# Patient Record
Sex: Female | Born: 2005
Health system: Southern US, Community
[De-identification: ages and names within clinical notes are randomized; demographics above are authoritative.]

## PROBLEM LIST (undated history)

## (undated) HISTORY — PX: TONSILLECTOMY: SUR1361

---
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
## 2023-02-16 DIAGNOSIS — N898 Other specified noninflammatory disorders of vagina: Secondary | ICD-10-CM | POA: Diagnosis not present

## 2023-03-30 ENCOUNTER — Telehealth: Payer: 59 | Admitting: Family Medicine

## 2023-03-30 DIAGNOSIS — R6889 Other general symptoms and signs: Secondary | ICD-10-CM

## 2023-03-31 NOTE — Progress Notes (Signed)

## 2023-04-28 ENCOUNTER — Encounter: Payer: Self-pay | Admitting: Family Medicine

## 2023-04-28 ENCOUNTER — Telehealth: Payer: 59 | Admitting: Family Medicine

## 2023-04-28 DIAGNOSIS — J069 Acute upper respiratory infection, unspecified: Secondary | ICD-10-CM

## 2023-04-28 NOTE — Progress Notes (Signed)

## 2023-05-01 ENCOUNTER — Ambulatory Visit (INDEPENDENT_AMBULATORY_CARE_PROVIDER_SITE_OTHER): Payer: 59 | Admitting: Adult Health

## 2023-05-01 ENCOUNTER — Encounter: Payer: Self-pay | Admitting: Adult Health

## 2023-05-01 DIAGNOSIS — F4323 Adjustment disorder with mixed anxiety and depressed mood: Secondary | ICD-10-CM | POA: Diagnosis not present

## 2023-05-01 DIAGNOSIS — F909 Attention-deficit hyperactivity disorder, unspecified type: Secondary | ICD-10-CM

## 2023-05-01 DIAGNOSIS — Z30432 Encounter for removal of intrauterine contraceptive device: Secondary | ICD-10-CM | POA: Diagnosis not present

## 2023-05-01 DIAGNOSIS — Z113 Encounter for screening for infections with a predominantly sexual mode of transmission: Secondary | ICD-10-CM | POA: Diagnosis not present

## 2023-05-01 DIAGNOSIS — Z30430 Encounter for insertion of intrauterine contraceptive device: Secondary | ICD-10-CM | POA: Diagnosis not present

## 2023-05-01 MED ORDER — AMPHETAMINE-DEXTROAMPHET ER 30 MG PO CP24: 30.0000 mg | ORAL_CAPSULE | Freq: Every day | ORAL | 0 refills | Status: DC

## 2023-05-01 NOTE — Progress Notes (Signed)
Jody Lewis [id number] [birth year] 17 y.o.  Subjective:   Patient ID:  Jody Lewis is a 17 y.o. (DOB 05-18-2006) female.  Chief Complaint: No chief complaint on file.   HPI Jody Lewis presents to the office today for follow-up of ADHD and Adjustment Disorder.   Accompanied by mother.  Describes mood today as "ok". Pleasant. Mood symptoms - reports depression some days - for several months. Reports anxiety and irritability. Reports more irritability since stopping vaping 1 month ago. Denies panic attacks. Reports some worry, rumination, and over thinking. Mood is stable. Stating "I feel very overwhelmed by everything right now". Has spent the last 2 weeks "emotionally detaching" from things.  Would like to restart the Adderall XR 30mg  daily. Stable interest and motivation. Taking medications as prescribed.  Energy levels stable. Active, has a regular exercise routine.   Enjoys some usual interests and activities. Dating. Has a boyfriend. Has a brother. Parents divorced. Living with mother. Spending time with family. Appetite adequate. Weight stable - 135 pounds 65". Sleeps well most nights. Averages 6 hours.  Focus and concentration difficulties - has been without Adderall over a month. Completing tasks. Managing aspects of household. 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 Denies SI or HI.  Denies AH or VH. Denies self harm. Denies substance use.  Previous medication trials: Adderall XR 10mg  daily    Review of Systems:  Review of Systems  Musculoskeletal:  Negative for gait problem.  Neurological:  Negative for tremors.  Psychiatric/Behavioral:         Please refer to HPI    Medications: I have reviewed the patient's current medications.  Current Outpatient Medications  Medication Sig Dispense Refill   amphetamine-dextroamphetamine (ADDERALL XR) 30 MG 24 hr capsule Take 1 capsule (30 mg total) by mouth daily. 30  capsule 0   amphetamine-dextroamphetamine (ADDERALL XR) 30 MG 24 hr capsule Take 1 capsule (30 mg total) by mouth daily. 30 capsule 0   amphetamine-dextroamphetamine (ADDERALL XR) 30 MG 24 hr capsule Take 1 capsule (30 mg total) by mouth daily. 30 capsule 0   No current facility-administered medications for this visit.    Medication Side Effects: None  Allergies:  Allergies  Allergen Reactions   Doxycycline Itching and Other (See Comments)    Hot and cold flashes     No past medical history on file.  Past Medical History, Surgical history, Social history, and Family history were reviewed and updated as appropriate.   Please see review of systems for further details on the patient's review from today.   Objective:   Physical Exam:  There were no vitals taken for this visit.  Physical Exam Constitutional:      General: She is not in acute distress. Musculoskeletal:        General: No deformity.  Neurological:     Mental Status: She is alert and oriented to person, place, and time.     Coordination: Coordination normal.  Psychiatric:        Attention and Perception: Attention and perception normal. She does not perceive auditory or visual hallucinations.        Mood and Affect: Affect is not labile, blunt, angry or inappropriate.        Speech: Speech normal.        Behavior: Behavior normal.        Thought Content: Thought content normal. Thought content is not paranoid or delusional. Thought content does not include homicidal  or suicidal ideation. Thought content does not include homicidal or suicidal plan.        Cognition and Memory: Cognition and memory normal.        Judgment: Judgment normal.     Comments: Insight intact     Lab Review:  No results found for: "NA", "K", "CL", "CO2", "GLUCOSE", "BUN", "CREATININE", "CALCIUM", "PROT", "ALBUMIN", "AST", "ALT", "ALKPHOS", "BILITOT", "GFRNONAA", "GFRAA"     Component Value Date/Time   WBC 9.0 12/14/2014 1028   RBC  4.89 12/14/2014 1028   HGB 13.7 12/14/2014 1028   HCT 41.3 12/14/2014 1028   MCV 84.5 12/14/2014 1028   MCH 28.0 12/14/2014 1028   MCHC 33.2 12/14/2014 1028    No results found for: "POCLITH", "LITHIUM"   No results found for: "PHENYTOIN", "PHENOBARB", "VALPROATE", "CBMZ"   .res Assessment: Plan:    Plan:  1. Adderall XR 30mg  daily  103/71/69   [facility] testing completed in office- 47/58 ADHD likely at initial visit.  Monitor BP between visits while taking stimulant medication.   RTC 3 months  Time spent with patient was 20 minutes. Greater than 50% of face to face time with patient was spent on counseling and coordination of care.    Patient advised to contact office with any questions, adverse effects, or acute worsening in signs and symptoms.  Discussed potential benefits, risks, and side effects of stimulants with patient to include increased heart rate, palpitations, insomnia, increased anxiety, increased irritability, or decreased appetite. Instructed patient to contact office if experiencing any significant tolerability issues.  Diagnoses and all orders for this visit:  Attention deficit hyperactivity disorder (ADHD), unspecified ADHD type  Adjustment disorder with mixed anxiety and depressed mood     Please see After Visit Summary for patient specific instructions.  No future appointments.   No orders of the defined types were placed in this encounter.   -------------------------------

## 2023-05-01 NOTE — Addendum Note (Signed)
Addended by: Dorothyann Gibbs on: 05/01/2023 08:28 AM   Modules accepted: Orders

## 2023-05-05 ENCOUNTER — Telehealth: Payer: 59 | Admitting: Family Medicine

## 2023-05-05 DIAGNOSIS — J069 Acute upper respiratory infection, unspecified: Secondary | ICD-10-CM | POA: Diagnosis not present

## 2023-05-05 NOTE — Progress Notes (Signed)

## 2023-05-08 DIAGNOSIS — Z30431 Encounter for routine checking of intrauterine contraceptive device: Secondary | ICD-10-CM | POA: Diagnosis not present

## 2023-06-19 ENCOUNTER — Ambulatory Visit: Payer: 59 | Admitting: Adult Health

## 2023-06-19 ENCOUNTER — Encounter: Payer: Self-pay | Admitting: Adult Health

## 2023-06-19 DIAGNOSIS — F909 Attention-deficit hyperactivity disorder, unspecified type: Secondary | ICD-10-CM

## 2023-06-19 NOTE — Progress Notes (Signed)
Jody Lewis [id number] 10/18/2005 17 y.o.  Subjective:   Patient ID:  Jody Lewis is a 17 y.o. (DOB 2005/08/10) female.  Chief Complaint: No chief complaint on file.   HPI Jody Lewis presents to the office today for follow-up of ADHD and Adjustment Disorder.   Accompanied by mother.  Describes mood today as "ok". Pleasant. Mood symptoms - denies depression,anxiety and irritability. Denies panic attacks. Denies worry, rumination, and over thinking. Mood is stable. Stating "I feel very like I'm doing good". Feels like the Adderall XR 30mg  daily works well for her. Stable interest and motivation. Taking medications as prescribed.  Energy levels stable. Active, has a regular exercise routine.   Enjoys some usual interests and activities. Dating. Has a boyfriend. Parents divorced. Living with mother. Spending time with family. Appetite adequate. Weight stable - 135 pounds 65". Sleeps well most nights. Averages 6 hours.  Focus and concentration improved. Completing tasks. Managing aspects of household. 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 Denies SI or HI.  Denies AH or VH. Denies self harm. Denies substance use.  Previous medication trials: Adderall XR 10mg  daily       Review of Systems:  Review of Systems  Musculoskeletal:  Negative for gait problem.  Neurological:  Negative for tremors.  Psychiatric/Behavioral:         Please refer to HPI    Medications: I have reviewed the patient's current medications.  Current Outpatient Medications  Medication Sig Dispense Refill   amphetamine-dextroamphetamine (ADDERALL XR) 30 MG 24 hr capsule Take 1 capsule (30 mg total) by mouth daily. 30 capsule 0   amphetamine-dextroamphetamine (ADDERALL XR) 30 MG 24 hr capsule Take 1 capsule (30 mg total) by mouth daily. 30 capsule 0   amphetamine-dextroamphetamine (ADDERALL XR) 30 MG 24 hr capsule Take 1 capsule (30 mg total) by mouth  daily. 30 capsule 0   No current facility-administered medications for this visit.    Medication Side Effects: None  Allergies:  Allergies  Allergen Reactions   Doxycycline Itching and Other (See Comments)    Hot and cold flashes     No past medical history on file.  Past Medical History, Surgical history, Social history, and Family history were reviewed and updated as appropriate.   Please see review of systems for further details on the patient's review from today.   Objective:   Physical Exam:  There were no vitals taken for this visit.  Physical Exam Constitutional:      General: She is not in acute distress. Musculoskeletal:        General: No deformity.  Neurological:     Mental Status: She is alert and oriented to person, place, and time.     Coordination: Coordination normal.  Psychiatric:        Attention and Perception: Attention and perception normal. She does not perceive auditory or visual hallucinations.        Mood and Affect: Affect is not labile, blunt, angry or inappropriate.        Speech: Speech normal.        Behavior: Behavior normal.        Thought Content: Thought content normal. Thought content is not paranoid or delusional. Thought content does not include homicidal or suicidal ideation. Thought content does not include homicidal or suicidal plan.        Cognition and Memory: Cognition and memory normal.        Judgment: Judgment normal.  Comments: Insight intact     Lab Review:  No results found for: "NA", "K", "CL", "CO2", "GLUCOSE", "BUN", "CREATININE", "CALCIUM", "PROT", "ALBUMIN", "AST", "ALT", "ALKPHOS", "BILITOT", "GFRNONAA", "GFRAA"     Component Value Date/Time   WBC 9.0 12/14/2014 1028   RBC 4.89 12/14/2014 1028   HGB 13.7 12/14/2014 1028   HCT 41.3 12/14/2014 1028   MCV 84.5 12/14/2014 1028   MCH 28.0 12/14/2014 1028   MCHC 33.2 12/14/2014 1028    No results found for: "POCLITH", "LITHIUM"   No results found for:  "PHENYTOIN", "PHENOBARB", "VALPROATE", "CBMZ"   .res Assessment: Plan:   Plan:  1. Adderall XR 30mg  daily  [facility] testing completed in office- 47/58 ADHD likely at initial visit.  Monitor BP between visits while taking stimulant medication.   RTC 3 months  Time spent with patient was 20 minutes. Greater than 50% of face to face time with patient was spent on counseling and coordination of care.    Patient advised to contact office with any questions, adverse effects, or acute worsening in signs and symptoms.  Discussed potential benefits, risks, and side effects of stimulants with patient to include increased heart rate, palpitations, insomnia, increased anxiety, increased irritability, or decreased appetite. Instructed patient to contact office if experiencing any significant tolerability issues. Diagnoses and all orders for this visit:  Attention deficit hyperactivity disorder (ADHD), unspecified ADHD type     Please see After Visit Summary for patient specific instructions.  Future Appointments  Date Time Provider Department Center  09/11/2023 10:00 AM Rajeev Escue, Thereasa Solo, NP [facility] None    No orders of the defined types were placed in this encounter.   -------------------------------

## 2023-07-19 ENCOUNTER — Other Ambulatory Visit: Payer: Self-pay

## 2023-07-19 ENCOUNTER — Telehealth: Payer: Self-pay | Admitting: Adult Health

## 2023-07-19 DIAGNOSIS — F909 Attention-deficit hyperactivity disorder, unspecified type: Secondary | ICD-10-CM

## 2023-07-19 MED ORDER — AMPHETAMINE-DEXTROAMPHET ER 30 MG PO CP24: 30.0000 mg | ORAL_CAPSULE | Freq: Every day | ORAL | 0 refills | Status: DC

## 2023-07-19 NOTE — Telephone Encounter (Signed)
 PENDED ADDERALL  30 XR TO [facility].

## 2023-07-19 NOTE — Telephone Encounter (Signed)
 Mom called asking for a refill on her daughter's adderall  xr 30 mg. Pharmacy is [facility] on [address]

## 2023-09-11 ENCOUNTER — Encounter: Payer: Self-pay | Admitting: Adult Health

## 2023-09-11 ENCOUNTER — Telehealth: Payer: 59 | Admitting: Adult Health

## 2023-09-11 DIAGNOSIS — F909 Attention-deficit hyperactivity disorder, unspecified type: Secondary | ICD-10-CM | POA: Diagnosis not present

## 2023-09-11 DIAGNOSIS — F4323 Adjustment disorder with mixed anxiety and depressed mood: Secondary | ICD-10-CM

## 2023-09-11 DIAGNOSIS — F4320 Adjustment disorder, unspecified: Secondary | ICD-10-CM

## 2023-09-11 MED ORDER — AMPHETAMINE-DEXTROAMPHET ER 30 MG PO CP24: 30.0000 mg | ORAL_CAPSULE | Freq: Every day | ORAL | 0 refills | Status: AC

## 2023-09-11 NOTE — Progress Notes (Signed)
 Jody Lewis [id number] [birth year] 18 y.o.  Virtual Visit via Video Note  I connected with pt @ on 09/11/23 at 10:00 AM EDT by a video enabled telemedicine application and verified that I am speaking with the correct person using two identifiers.   I discussed the limitations of evaluation and management by telemedicine and the availability of in person appointments. The patient expressed understanding and agreed to proceed.  I discussed the assessment and treatment plan with the patient. The patient was provided an opportunity to ask questions and all were answered. The patient agreed with the plan and demonstrated an understanding of the instructions.   The patient was advised to call back or seek an in-person evaluation if the symptoms worsen or if the condition fails to improve as anticipated.  I provided 15 minutes of non-face-to-face time during this encounter.  The patient was located at home.  The provider was located at [facility].   Jody Gibbs, NP   Subjective:   Patient ID:  Jody Lewis is a 18 y.o. (DOB [birth year]) female.  Chief Complaint: No chief complaint on file.   HPI Jody Lewis presents for follow-up of ADHD and Adjustment Disorder.   Describes mood today as "ok". Pleasant. Denies tearfulness. Mood symptoms - denies depression, anxiety and irritability. Reports stable interest and motivation. Denies panic attacks. Denies worry, rumination, and over thinking. Mood is stable. Stating "I feel like I'm doing good". Feels like the Adderall XR 30mg  daily works well for her.  Taking medications as prescribed.  Energy levels stable. Active, has a regular exercise routine.   Enjoys some usual interests and activities. Dating. Has a boyfriend. Parents divorced. Living with mother. Spending time with family. Appetite adequate. Weight stable - 135 pounds 65". Sleeps well most nights. Averages 6.5 to 7 hours - 8 to 9 hours a few nights a week.  Focus and  concentration stable. Completing tasks. Managing aspects of household. 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  Denies SI or HI.  Denies AH or VH. Denies self harm. Denies substance use.  Previous medication trials: Adderall XR 10mg  daily      Review of Systems:  Review of Systems  Musculoskeletal:  Negative for gait problem.  Neurological:  Negative for tremors.  Psychiatric/Behavioral:         Please refer to HPI    Medications: I have reviewed the patient's current medications.  Current Outpatient Medications  Medication Sig Dispense Refill   amphetamine-dextroamphetamine (ADDERALL XR) 30 MG 24 hr capsule Take 1 capsule (30 mg total) by mouth daily. 30 capsule 0   amphetamine-dextroamphetamine (ADDERALL XR) 30 MG 24 hr capsule Take 1 capsule (30 mg total) by mouth daily. 30 capsule 0   amphetamine-dextroamphetamine (ADDERALL XR) 30 MG 24 hr capsule Take 1 capsule (30 mg total) by mouth daily. 30 capsule 0   No current facility-administered medications for this visit.    Medication Side Effects: None  Allergies:  Allergies  Allergen Reactions   Doxycycline Itching and Other (See Comments)    Hot and cold flashes     No past medical history on file.  Family History  Problem Relation Age of Onset   Diabetes Paternal Grandfather     Social History   Socioeconomic History   Marital status: Single    Spouse name: Not on file   Number of children: Not on file   Years of education: Not on file   Highest education level: Not on file  Occupational  History   Not on file  Tobacco Use   Smoking status: Never   Smokeless tobacco: Never  Substance and Sexual Activity   Alcohol use: No    Alcohol/week: 0.0 standard drinks of alcohol   Drug use: No   Sexual activity: Never  Other Topics Concern   Not on file  Social History Narrative   Not on file   Social Drivers of Health   Financial Resource Strain: Not on file  Food Insecurity: Not on file   Transportation Needs: Not on file  Physical Activity: Not on file  Stress: Not on file  Social Connections: Not on file  Intimate Partner Violence: Not on file    Past Medical History, Surgical history, Social history, and Family history were reviewed and updated as appropriate.   Please see review of systems for further details on the patient's review from today.   Objective:   Physical Exam:  There were no vitals taken for this visit.  Physical Exam Constitutional:      General: She is not in acute distress. Musculoskeletal:        General: No deformity.  Neurological:     Mental Status: She is alert and oriented to person, place, and time.     Coordination: Coordination normal.  Psychiatric:        Attention and Perception: Attention and perception normal. She does not perceive auditory or visual hallucinations.        Mood and Affect: Affect is not labile, blunt, angry or inappropriate.        Speech: Speech normal.        Behavior: Behavior normal.        Thought Content: Thought content normal. Thought content is not paranoid or delusional. Thought content does not include homicidal or suicidal ideation. Thought content does not include homicidal or suicidal plan.        Cognition and Memory: Cognition and memory normal.        Judgment: Judgment normal.     Comments: Insight intact     Lab Review:  No results found for: "NA", "K", "CL", "CO2", "GLUCOSE", "BUN", "CREATININE", "CALCIUM", "PROT", "ALBUMIN", "AST", "ALT", "ALKPHOS", "BILITOT", "GFRNONAA", "GFRAA"     Component Value Date/Time   WBC 9.0 12/14/2014 1028   RBC 4.89 12/14/2014 1028   HGB 13.7 12/14/2014 1028   HCT 41.3 12/14/2014 1028   MCV 84.5 12/14/2014 1028   MCH 28.0 12/14/2014 1028   MCHC 33.2 12/14/2014 1028    No results found for: "POCLITH", "LITHIUM"   No results found for: "PHENYTOIN", "PHENOBARB", "VALPROATE", "CBMZ"   .res Assessment: Plan:    Plan:  1. Adderall XR 30mg   daily  [facility] ADHD testing completed in office- 47/58 ADHD likely at initial visit.  Monitor BP between visits while taking stimulant medication.   RTC 3 months  15 minutes spent dedicated to the care of this patient on the date of this encounter to include pre-visit review of records, ordering of medication, post visit documentation, and face-to-face time with the patient discussing ADD and adjustment disorder. Discussed continuing current medication regimen.  Patient advised to contact office with any questions, adverse effects, or acute worsening in signs and symptoms.  Discussed potential benefits, risks, and side effects of stimulants with patient to include increased heart rate, palpitations, insomnia, increased anxiety, increased irritability, or decreased appetite. Instructed patient to contact office if experiencing any significant tolerability issues.  There are no diagnoses linked to this encounter.  Please see After Visit Summary for patient specific instructions.  Future Appointments  Date Time Provider Department Center  09/11/2023 10:00 AM Oliviya Gilkison, Thereasa Solo, NP [facility] None    No orders of the defined types were placed in this encounter.     -------------------------------

## 2023-09-20 ENCOUNTER — Telehealth: Admitting: Family Medicine

## 2023-09-20 DIAGNOSIS — J069 Acute upper respiratory infection, unspecified: Secondary | ICD-10-CM | POA: Diagnosis not present

## 2023-09-20 MED ORDER — AZITHROMYCIN 250 MG PO TABS: ORAL_TABLET | ORAL | 0 refills | Status: AC

## 2023-09-20 NOTE — Patient Instructions (Signed)
  Jody Lewis, thank you for joining Freddy Finner, NP for today's virtual visit.  While this provider is not your primary care provider (PCP), if your PCP is located in our provider database this encounter information will be shared with them immediately following your visit.   A [facility] MyChart account gives you access to today's visit and all your visits, tests, and labs performed at [facility] " click here if you don't have a [facility] MyChart account or go to [email or web address]  Consent: (Patient) Jody Lewis provided verbal consent for this virtual visit at the beginning of the encounter.  Current Medications:  Current Outpatient Medications:    [START ON 09/22/2023] azithromycin (ZITHROMAX) 250 MG tablet, Take 2 tablets on day 1, then 1 tablet daily on days 2 through 5, Disp: 6 tablet, Rfl: 0   [START ON 11/06/2023] amphetamine-dextroamphetamine (ADDERALL XR) 30 MG 24 hr capsule, Take 1 capsule (30 mg total) by mouth daily., Disp: 30 capsule, Rfl: 0   [START ON 10/09/2023] amphetamine-dextroamphetamine (ADDERALL XR) 30 MG 24 hr capsule, Take 1 capsule (30 mg total) by mouth daily., Disp: 30 capsule, Rfl: 0   amphetamine-dextroamphetamine (ADDERALL XR) 30 MG 24 hr capsule, Take 1 capsule (30 mg total) by mouth daily., Disp: 30 capsule, Rfl: 0   Medications ordered in this encounter:  Meds ordered this encounter  Medications   azithromycin (ZITHROMAX) 250 MG tablet    Sig: Take 2 tablets on day 1, then 1 tablet daily on days 2 through 5    Dispense:  6 tablet    Refill:  0    Supervising Provider:   Merrilee Jansky [phone]     *If you need refills on other medications prior to your next appointment, please contact your pharmacy*  Follow-Up: Call back or seek an in-person evaluation if the symptoms worsen or if the condition fails to improve as anticipated.  [facility] [phone]  Other Instructions  URI recommendations: -  Increased rest - Increasing Fluids - Acetaminophen / ibuprofen as needed for fever/pain.  - Salt water gargling, chloraseptic spray and throat lozenges - Mucinex if mucus is present and increasing.  - Saline nasal spray if congestion or if nasal passages feel dry. - Humidifying the air.     If you have been instructed to have an in-person evaluation today at a local Urgent Care facility, please use the link below. It will take you to a list of all of our available [facility], including address, phone number and hours of operation. Please do not delay care.  [facility]  If you or a family member do not have a primary care provider, use the link below to schedule a visit and establish care. When you choose a [facility] primary care physician or advanced practice provider, you gain a long-term partner in health. Find a Primary Care Provider  Learn more about 's in-office and virtual care options:  - Get Care Now

## 2023-09-20 NOTE — Progress Notes (Signed)
 Virtual Visit Consent   Jody Lewis, you are scheduled for a virtual visit with a [facility] provider today. Just as with appointments in the office, your consent must be obtained to participate. Your consent will be active for this visit and any virtual visit you may have with one of our providers in the next 365 days. If you have a MyChart account, a copy of this consent can be sent to you electronically.  As this is a virtual visit, video technology does not allow for your provider to perform a traditional examination. This may limit your provider's ability to fully assess your condition. If your provider identifies any concerns that need to be evaluated in person or the need to arrange testing (such as labs, EKG, etc.), we will make arrangements to do so. Although advances in technology are sophisticated, we cannot ensure that it will always work on either your end or our end. If the connection with a video visit is poor, the visit may have to be switched to a telephone visit. With either a video or telephone visit, we are not always able to ensure that we have a secure connection.  By engaging in this virtual visit, you consent to the provision of healthcare and authorize for your insurance to be billed (if applicable) for the services provided during this visit. Depending on your insurance coverage, you may receive a charge related to this service.  I need to obtain your verbal consent now. Are you willing to proceed with your visit today? Aymee Yoo has provided verbal consent on 09/20/2023 for a virtual visit (video or telephone). Freddy Finner, NP  Date: 09/20/2023 8:32 AM   Virtual Visit via Video Note   I, Freddy Finner, connected with  Contina Schweiger  ([id number], 05-Dec-2005) on 09/20/23 at  8:30 AM EDT by a video-enabled telemedicine application and verified that I am speaking with the correct person using two identifiers.  Location: Patient: Virtual Visit Location Patient:  Home Provider: Virtual Visit Location Provider: Home Office   I discussed the limitations of evaluation and management by telemedicine and the availability of in person appointments. The patient expressed understanding and agreed to proceed.    History of Present Illness: Jody Lewis is a 18 y.o. who identifies as a female who was assigned female at birth, and is being seen today for sore throat.  Onset was  Thursday night with congestion, runny nose- coughing over weekend, and that progressed to sore throat Associated symptoms are sore throat was in last 48 hours, body aches, no fevers or chills since the start   Modifying factors are ny quil, motrin and tylenol Denies chest pain, shortness of breath, fevers, chills  Exposure to sick contacts- known with brother-    Problems:  Patient Active Problem List   Diagnosis Date Noted   BMI (body mass index), pediatric, 5% to less than 85% for age 23/04/2021   Well child check 04/12/2021    Allergies:  Allergies  Allergen Reactions   Doxycycline Itching and Other (See Comments)    Hot and cold flashes    Medications:  Current Outpatient Medications:    [START ON 11/06/2023] amphetamine-dextroamphetamine (ADDERALL XR) 30 MG 24 hr capsule, Take 1 capsule (30 mg total) by mouth daily., Disp: 30 capsule, Rfl: 0   [START ON 10/09/2023] amphetamine-dextroamphetamine (ADDERALL XR) 30 MG 24 hr capsule, Take 1 capsule (30 mg total) by mouth daily., Disp: 30 capsule, Rfl: 0   amphetamine-dextroamphetamine (ADDERALL XR) 30 MG  24 hr capsule, Take 1 capsule (30 mg total) by mouth daily., Disp: 30 capsule, Rfl: 0  Observations/Objective: Patient is well-developed, well-nourished in no acute distress.  Resting comfortably  at home.  Head is normocephalic, atraumatic.  No labored breathing.  Speech is clear and coherent with logical content.  Patient is alert and oriented at baseline.  Congestion tone noted  Assessment and Plan:    1. Viral URI  with cough (Primary)  - azithromycin (ZITHROMAX) 250 MG tablet; Take 2 tablets on day 1, then 1 tablet daily on days 2 through 5  Dispense: 6 tablet; Refill: 0  -delayed Rx given almost a week of symptoms, but would like to wait another 48 hours prior to starting anything if it is needed.  -flonase and OTC allergy pill of choice start and continue if this is also overlapping allergies.  -school note  -follow up in person if worsening  Reviewed side effects, risks and benefits of medication.    Patient acknowledged agreement and understanding of the plan.   Past Medical, Surgical, Social History, Allergies, and Medications have been Reviewed.    Follow Up Instructions: I discussed the assessment and treatment plan with the patient. The patient was provided an opportunity to ask questions and all were answered. The patient agreed with the plan and demonstrated an understanding of the instructions.  A copy of instructions were sent to the patient via MyChart unless otherwise noted below.    The patient was advised to call back or seek an in-person evaluation if the symptoms worsen or if the condition fails to improve as anticipated.    Freddy Finner, NP

## 2024-05-08 DIAGNOSIS — Z01419 Encounter for gynecological examination (general) (routine) without abnormal findings: Secondary | ICD-10-CM | POA: Diagnosis not present

## 2024-05-08 DIAGNOSIS — Z1342 Encounter for screening for global developmental delays (milestones): Secondary | ICD-10-CM | POA: Diagnosis not present

## 2024-05-08 DIAGNOSIS — Z3040 Encounter for surveillance of contraceptives, unspecified: Secondary | ICD-10-CM | POA: Diagnosis not present

## 2024-05-08 DIAGNOSIS — E663 Overweight: Secondary | ICD-10-CM | POA: Diagnosis not present

## 2024-05-08 DIAGNOSIS — Z1330 Encounter for screening examination for mental health and behavioral disorders, unspecified: Secondary | ICD-10-CM | POA: Diagnosis not present

## 2024-05-08 DIAGNOSIS — Z113 Encounter for screening for infections with a predominantly sexual mode of transmission: Secondary | ICD-10-CM | POA: Diagnosis not present

## 2024-05-08 DIAGNOSIS — Z6825 Body mass index (BMI) 25.0-25.9, adult: Secondary | ICD-10-CM | POA: Diagnosis not present

## 2024-05-08 DIAGNOSIS — R6882 Decreased libido: Secondary | ICD-10-CM | POA: Diagnosis not present
